# Patient Record
Sex: Male | Born: 1955 | Hispanic: Yes | Marital: Married | Smoking: Never smoker
Health system: Southern US, Community
[De-identification: ages and names within clinical notes are randomized; demographics above are authoritative.]

## PROBLEM LIST (undated history)

## (undated) DIAGNOSIS — I10 Essential (primary) hypertension: Secondary | ICD-10-CM

## (undated) DIAGNOSIS — E7800 Pure hypercholesterolemia, unspecified: Secondary | ICD-10-CM

## (undated) DIAGNOSIS — E119 Type 2 diabetes mellitus without complications: Secondary | ICD-10-CM

---

## 2010-06-26 ENCOUNTER — Inpatient Hospital Stay: Payer: Self-pay | Admitting: Surgery

## 2010-06-26 HISTORY — PX: APPENDECTOMY: SHX54

## 2010-06-29 LAB — PATHOLOGY REPORT

## 2015-10-05 ENCOUNTER — Emergency Department
Admission: EM | Admit: 2015-10-05 | Discharge: 2015-10-06 | Disposition: A | Discharge status: Home / Self Care | Payer: BLUE CROSS/BLUE SHIELD | Attending: Emergency Medicine | Admitting: Emergency Medicine

## 2015-10-05 ENCOUNTER — Encounter: Payer: Self-pay | Admitting: Emergency Medicine

## 2015-10-05 DIAGNOSIS — S52611A Displaced fracture of right ulna styloid process, initial encounter for closed fracture: Secondary | ICD-10-CM | POA: Insufficient documentation

## 2015-10-05 DIAGNOSIS — Y999 Unspecified external cause status: Secondary | ICD-10-CM | POA: Diagnosis not present

## 2015-10-05 DIAGNOSIS — M546 Pain in thoracic spine: Secondary | ICD-10-CM | POA: Diagnosis not present

## 2015-10-05 DIAGNOSIS — Y92410 Unspecified street and highway as the place of occurrence of the external cause: Secondary | ICD-10-CM | POA: Insufficient documentation

## 2015-10-05 DIAGNOSIS — M25512 Pain in left shoulder: Secondary | ICD-10-CM | POA: Insufficient documentation

## 2015-10-05 DIAGNOSIS — Y9389 Activity, other specified: Secondary | ICD-10-CM | POA: Diagnosis not present

## 2015-10-05 DIAGNOSIS — M7918 Myalgia, other site: Secondary | ICD-10-CM

## 2015-10-05 DIAGNOSIS — S161XXA Strain of muscle, fascia and tendon at neck level, initial encounter: Secondary | ICD-10-CM | POA: Diagnosis not present

## 2015-10-05 DIAGNOSIS — M25531 Pain in right wrist: Secondary | ICD-10-CM | POA: Diagnosis present

## 2015-10-05 MED ORDER — SODIUM CHLORIDE 0.9 % IV BOLUS (SEPSIS)
1000.0000 mL | Freq: Once | INTRAVENOUS | Status: AC
  Administered 2015-10-06: 1000 mL via INTRAVENOUS
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
## 2015-10-06 ENCOUNTER — Emergency Department: Payer: BLUE CROSS/BLUE SHIELD

## 2015-10-06 ENCOUNTER — Encounter: Payer: Self-pay | Admitting: Radiology

## 2015-10-06 DIAGNOSIS — S52611A Displaced fracture of right ulna styloid process, initial encounter for closed fracture: Secondary | ICD-10-CM | POA: Diagnosis not present

## 2015-10-06 LAB — CBC WITH DIFFERENTIAL/PLATELET
BASOS PCT: 0 %
Basophils Absolute: 0.1 10*3/uL (ref 0–0.1)
EOS ABS: 0.2 10*3/uL (ref 0–0.7)
EOS PCT: 1 %
HCT: 45.1 % (ref 40.0–52.0)
Hemoglobin: 15.6 g/dL (ref 13.0–18.0)
LYMPHS ABS: 3.1 10*3/uL (ref 1.0–3.6)
Lymphocytes Relative: 24 %
MCH: 32.7 pg (ref 26.0–34.0)
MCHC: 34.5 g/dL (ref 32.0–36.0)
MCV: 94.6 fL (ref 80.0–100.0)
MONO ABS: 0.5 10*3/uL (ref 0.2–1.0)
MONOS PCT: 4 %
Neutro Abs: 9.1 10*3/uL — ABNORMAL HIGH (ref 1.4–6.5)
Neutrophils Relative %: 71 %
Platelets: 151 10*3/uL (ref 150–440)
RBC: 4.77 MIL/uL (ref 4.40–5.90)
RDW: 12.9 % (ref 11.5–14.5)
WBC: 12.8 10*3/uL — ABNORMAL HIGH (ref 3.8–10.6)

## 2015-10-06 LAB — COMPREHENSIVE METABOLIC PANEL
ALK PHOS: 79 U/L (ref 38–126)
ALT: 36 U/L (ref 17–63)
ANION GAP: 5 (ref 5–15)
AST: 33 U/L (ref 15–41)
Albumin: 4.6 g/dL (ref 3.5–5.0)
BUN: 17 mg/dL (ref 6–20)
CHLORIDE: 103 mmol/L (ref 101–111)
CO2: 28 mmol/L (ref 22–32)
Calcium: 9.2 mg/dL (ref 8.9–10.3)
Creatinine, Ser: 0.99 mg/dL (ref 0.61–1.24)
GFR calc Af Amer: >60 mL/min (ref >60)
GFR calc non Af Amer: >60 mL/min (ref >60)
GLUCOSE: 234 mg/dL — AB (ref 65–99)
POTASSIUM: 3.7 mmol/L (ref 3.5–5.1)
SODIUM: 136 mmol/L (ref 135–145)
Total Bilirubin: 0.7 mg/dL (ref 0.3–1.2)
Total Protein: 7.5 g/dL (ref 6.5–8.1)

## 2015-10-06 LAB — LIPASE, BLOOD: Lipase: 37 U/L (ref 11–51)

## 2015-10-06 MED ORDER — HYDROMORPHONE HCL 1 MG/ML IJ SOLN
1.0000 mg | INTRAMUSCULAR | Status: AC
  Administered 2015-10-06: 1 mg via INTRAVENOUS
  Filled 2015-10-06: qty 1

## 2015-10-06 MED ORDER — DIAZEPAM 5 MG PO TABS: 5.0000 mg | ORAL_TABLET | Freq: Three times a day (TID) | ORAL | Status: AC | PRN

## 2015-10-06 MED ORDER — OXYCODONE-ACETAMINOPHEN 5-325 MG PO TABS: 1.0000 | ORAL_TABLET | Freq: Four times a day (QID) | ORAL | Status: AC | PRN

## 2015-10-06 MED ORDER — NAPROXEN 500 MG PO TABS: 500.0000 mg | ORAL_TABLET | Freq: Two times a day (BID) | ORAL | Status: AC

## 2015-10-06 MED ORDER — TETANUS-DIPHTH-ACELL PERTUSSIS 5-2.5-18.5 LF-MCG/0.5 IM SUSP
0.5000 mL | Freq: Once | INTRAMUSCULAR | Status: AC
  Administered 2015-10-06: 0.5 mL via INTRAMUSCULAR
  Filled 2015-10-06: qty 0.5

## 2015-10-06 MED ORDER — IOPAMIDOL (ISOVUE-370) INJECTION 76%
125.0000 mL | Freq: Once | INTRAVENOUS | Status: AC | PRN
  Administered 2015-10-06: 125 mL via INTRAVENOUS
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
## 2015-10-06 NOTE — Discharge Instructions (Signed)
No maneje o use maquinas peligrosas cuando esta tomando Percocet (oxycodone) o Valium (diazepam).  Haga cita con el ortopediatra por tratamiento de su herida de la muneca derecha.  Distensin cervical (Cervical Sprain) Una distensin cervical es una lesin en el cuello, en la que los tejidos fuertes y fibrosos (ligamentos) que unen los huesos del cuello, se distienden o se rompen. Una distensin cervical puede ser desde muy leve a muy grave. En los casos graves pueden hacer que las vrtebras del cuello se vuelvan inestables. Esto puede causar un dao en la mdula espinal y puede dar Environmental consultant a graves problemas del [place]. La cantidad de tiempo que demora la mejora de una distensin cervical depende de la causa y de la extensin de la lesin. La mayora de las veces se cura en 1 a 3 semanas. CAUSAS  Las distensiones graves pueden ser causadas por:   Lesiones por deportes de contacto (como en el ftbol Public house manager, rugby, [place], hockey, automovilismo, gimnasia, buceo, artes [employer] y boxeo).  Colisiones en vehculos de motor.  Lesiones de Social research officer, government cervical. Esta es una lesin por movimiento brusco de adelante hacia atrs de la cabeza y el cuello.  Cadas. La causa de las distensiones cervicales leves pueden ser:   Adoptar posiciones incmodas, como sostener el telfono entre la oreja y [place].  Sentarse en una silla que no ofrece el soporte adecuado.  Trabajar en una mesa de computadora mal diseada.  Las [employer] Util[employer] que requieren mirar hacia arriba o hacia abajo durante largos perodos. SNTOMAS   Dolor, sensibilidad, rigidez, o sensacin de ardor en la parte anterior, posterior o lateral del cuello. Este malestar puede aparecer inmediatamente despus de la lesin o puede desarrollarse lentamente y no empezar hasta 24 horas o ms despus de la lesin.  Dolor o sensibilidad que se siente directamente en la parte media posterior del cuello.  Dolor en el hombro o la zona superior  de la espalda.  Capacidad limitada para mover el cuello.  Dolor de [place].  Mareos.  Debilidad, entumecimiento u hormigueo en las manos o los brazos.  Espasmos musculares.  Dificultades para tragar o comer.  Sensibilidad e hinchazn en el cuello. DIAGNSTICO  La mayora de las veces, el mdico puede diagnosticar este problema mediante la historia clnica y un examen fsico. Su mdico le preguntar acerca de lesiones previas y problemas conocidos como artritis en el cuello. Podrn tomarle radiografas para determinar si hay otros problemas, como enfermedades en los huesos del cuello. Tambin puede ser [employer] realizar otras pruebas, como tomografas computadas o Health visitor.  TRATAMIENTO  El tratamiento depende de la gravedad de la distensin. Las distensiones leves se pueden tratar con reposo, manteniendo el cuello en su lugar (inmovilizacin) y usando medicamentos para Chief Technology Officer. Las distensiones graves deben ser inmediatamente inmovilizadas. Ser necesario completar el tratamiento para Engineer, materials, los espasmos musculares y otros sntomas, y puede incluir.  Medicamentos como calmantes para el dolor, anestsicos o relajantes musculares.  Fisioterapia. Esto puede incluir ejercicios de elongacin, fortalecimiento y Fish farm manager de Armed forces logistics/support/administrative officer. Los ejercicios y [place] mejor postura pueden ayudar a estabilizar el cuello, fortalecer los msculos y evitar que los sntomas vuelvan a Research officer, trade union. INSTRUCCIONES PARA EL CUIDADO EN EL HOGAR   Aplique hielo sobre la zona lesionada.  Ponga el hielo en una bolsa plstica.  Colquese una toalla entre la piel y la bolsa de hielo.  Deje el hielo durante 15 - 20 minutos y aplquelo 3 - 4 veces por Futures trader.  Si la  lesin fue grave, le indicarn el uso de un collarn cervical. El collarn cervical es un collar de dos piezas para impedir que el cuello se mueva [place] se [place].  Nose quite el collarn excepto que se lo indique su mdico.  Si  tiene el cabello largo, mantngalo fuera del collarn.  Consulte a su mdico antes de hacerle ajustes. Los [employer] [place] [employer] pueden ser requeridos con el tiempo para Careers information officer confort y reducir la presin sobre la barbilla o en la parte posterior de la cabeza.  Si le permiten quitarse el collarn para lavarlo o darse un bao, siga las indicaciones de su mdico acerca de cmo hacerlo con seguridad.  Mantenga el collarn limpio pasando un pao con agua y [place] y secndolo bien. Si el collarn tiene almohadillas removibles, qutelas cada 1-2 das para lavarlas a mano con agua y [place]. Deje que se sequen al aire. Debe secarlas bien antes de volver a colocarlas en el collarn.  Si le permiten quitarse el collarn para lavarlo y darse un bao, lave y seque la piel del cuello. Controle su piel para detectar irritacin o llagas. Si las tiene, informe a su mdico.  No conduzca vehculos mientras [place] el collarn.  Slo tome medicamentos de venta libre o recetados para Primary school teacher, el malestar o bajar la fiebre, segn las indicaciones de su mdico.  Cumpla con todas las visitas de control, segn le indique su mdico.  Cumpla con todas las sesiones de fisioterapia, segn le indique su mdico.  Haga los ajustes necesarios en su lugar de [place] para favorecer una buena postura.  Evite las posiciones y actividades que [employer] sntomas.  Haga precalentamiento y elongue antes de comenzar una actividad para Physiological scientist. SOLICITE ATENCIN MDICA SI:   El dolor no se alivia con los [employer].  No puede disminuir la dosis de analgsicos segn lo planificado.  Su nivel de actividad no mejora segn lo esperado. SOLICITE ATENCIN MDICA DE INMEDIATO SI:   Presenta cualquier hemorragia.  Siente Programme researcher, broadcasting/film/video.  Tiene signos de reaccin alrgica a los medicamentos.  Los sntomas empeoran.  Le aparecen sntomas nuevos e inexplicables.  Siente adormecimiento, hormigueo, debilidad o  parlisis en alguna parte del cuerpo. ASEGRESE DE QUE:   Comprende estas instrucciones.  Controlar su afeccin.  Recibir ayuda de inmediato si no mejora o si empeora.   Esta informacin no tiene Theme park manager el consejo del mdico. Asegrese de hacerle al mdico cualquier pregunta que tenga.   Document Released: 09/02/2008 Document Revised: 03/27/2013 Elsevier Interactive Patient Education [employer]  Distensin muscular. (Muscle Strain) Una distensin muscular es una lesin que se produce cuando un msculo se estira ms all de su largo normal. Cuando esto sucede, por lo general se desgarra un pequeo nmero de fibras musculares. La distensin muscular se califica en grados. Las distensiones de Museum/gallery conservator son aquellas en las cuales el desgarro y el dolor afectan a la menor cantidad de fibras musculares. Las distensiones de segundo y tercer grado involucran una proporcin cada vez mayor de desgarro y Engineer, mining.  En general, la recuperacin de una distensin muscular tarda de 1 a 2semanas. La curacin completa tarda de 5 a 6semanas.  CAUSAS  Las distensiones musculares ocurren cuando se aplica una fuerza violenta y repentina sobre un msculo y este se estira demasiado. Esto puede ocurrir cuando se Hydrographic surveyor, se practican deportes o en una cada.  FACTORES DE RIESGO La distensin muscular es especialmente comn en los atletas.  SIGNOS  Y SNTOMAS En el lugar de la distensin muscular se puede presentar lo siguiente:  Engineer, mining.  [place].  Hinchazn.  Dificultad para usar el msculo debido al dolor o a un funcionamiento anormal. DIAGNSTICO  El mdico le har un examen fsico y le har preguntas sobre sus antecedentes mdicos. TRATAMIENTO  Con frecuencia, el mejor tratamiento para una distensin muscular es el reposo, y la aplicacin de hielo y de compresas fras en la zona de la lesin.  INSTRUCCIONES PARA EL CUIDADO EN EL HOGAR   Use el mtodo PRICE (por sus  siglas en ingls) de tratamiento para estimular la curacin durante los primeros 2 a 3das posteriores a la lesin. El mtodo PRICE implica lo siguiente:  Proteger al msculo de nuevas lesiones.  Limitar la actividad y Lawyer la parte del cuerpo lesionada.  Aplicar hielo a la lesin. Para hacerlo, ponga hielo en una bolsa plstica. Coloque una toalla entre la piel y la bolsa de hielo. Luego aplique el hielo y djelo actuar de 15 a por hora. Despus del Press photographer, cambie a compresas de calor hmedo.  Comprimir la zona lesionada con una frula o venda elstica. Tenga cuidado de no ajustarla demasiado. Esto puede interferir con la circulacin sangunea o aumentar la hinchazn.  Mantener la zona lesionada por encima del nivel del corazn con la mayor frecuencia posible.  Utilice los medicamentos de venta libre o recetados para Primary school teacher, el malestar o la fiebre, segn se lo indique el mdico.  Education officer, environmental un calentamiento antes de hacer ejercicio ayuda a prevenir distensiones musculares futuras. SOLICITE ATENCIN MDICA SI:   Siente un dolor cada vez ms intenso o hinchazn en la zona lesionada.  Siente adormecimiento, hormigueo o nota una prdida importante de fuerza en la zona lesionada. ASEGRESE DE QUE:   Comprende estas instrucciones.  Controlar su afeccin.  Recibir ayuda de inmediato si no mejora o si empeora.   Esta informacin no tiene Theme park manager el consejo del mdico. Asegrese de hacerle al mdico cualquier pregunta que tenga.   Document Released: 03/16/2005 Document Revised: 03/27/2013 Elsevier Interactive Patient Education 2016 [employer].  Fractura de [place] (Wrist Fracture) La fractura de mueca es la rotura de uno de los huesos que la forman. La mueca est formada por ocho huesos pequeos en la palma de la mano (huesos carpianos) y [employer] largos que componen el Product manager (radio) y (cbito).  CAUSAS   Un golpe directo en la  mueca.  Cada sobre una mano extendida.  Traumatismos, como un accidente automovilstico o una cada. FACTORES DE RIESGO Los factores de riesgo de fractura de [place] incluyen:   Education administrator deportes de contacto y de alto riesgo, como esqu, ciclismo y patinaje sobre hielo.  Tomar corticoides.  Fumar.  Ser mujer.  Ser de Engineer, manufacturing.  Consumir ms de tres bebidas alcohlicas por da.  Tener baja densidad sea o disminucin de la densidad sea (osteoporosis u osteopenia).  La edad. En los [employer], la densidad sea [place].  Las [employer].  Antecedentes de fracturas previas. SIGNOS Y SNTOMAS Los sntomas de una fractura de mueca incluyen dolor con la palpacin, hematomas e hinchazn. Adems, la mueca puede colgar en una posicin extraa o verse deformada.  DIAGNSTICO El diagnstico puede incluir:  Examen fsico.  Radiografas. TRATAMIENTO El tratamiento depende de muchos factores, por ejemplo el tipo y la ubicacin de la [place], su edad y su nivel de [place]. El tratamiento puede ser quirrgico o no quirrgico.  Tratamiento no quirrgico  Le colocarn un yeso o una frula en la [place], si el hueso est en una posicin correcta. Si la fractura no est en una posicin correcta, tal vez sea necesario que el mdico corrija el desplazamiento antes de poner el yeso o la frula. Generalmente, el yeso o la frula se usarn durante varias semanas.  Tratamiento quirrgico En algunos CIT Groupcasos las partes del hueso estn tan fuera de Environmental consultantlugar que es necesario recurrir a Physiological scientistuna ciruga para Scientific laboratory techniciancolocar un dispositivo que las mantenga unidas mientras se [place]. Segn el tipo de fractura, hay diferentes opciones para mantener el Dow Chemicalhueso en su lugar mientras se consolida, por ejemplo un yeso y pernos de metal.  INSTRUCCIONES PARA EL CUIDADO EN EL HOGAR  Mantenga la mueca lesionada elevada y Valero Energymueva los dedos tanto como pueda.  No ejerza presin sobre cualquier parte del yeso o  la frula. Podra romperse.   Use una bolsa plstica para proteger el yeso o la frula cuando tome un bao o [place]una ducha. No sumerja el yeso o la frula en el agua.  Tome los medicamentos solamente como se lo haya indicado el mdico.  Mantenga el yeso o la frula limpios y secos. Si se mojan, se daan o de repente le ajustan demasiado, comunquese de inmediato con el mdico.  No consuma ningn producto que contenga tabaco, como cigarrillos, tabaco de Theatre managermascar o Administrator, Civil Servicecigarrillos electrnicos. El tabaco puede retardar la consolidacin de la fractura. Si necesita ayuda para dejar de fumar, consulte al mdico.  Concurra a todas las visitas de control como se lo haya indicado el mdico. Esto es importante.  Pregntele al mdico si debe tomar suplementos de calcio y vitaminasC yD para promover la consolidacin de Printmakerla fractura. SOLICITE ATENCIN MDICA SI:   El yeso o la frula se daan, se rompen o se mojan.  Tiene fiebre.  Tiene escalofros.  Siente un dolor fuerte y continuo o est ms hinchado que antes de que le colocaran el yeso. SOLICITE ATENCIN MDICA DE INMEDIATO SI:   La mano o las uas de la mano del brazo lesionado se tornan azules o grises, o estn fras o adormecidas.  Pierde la sensibilidad en los dedos de la mano del brazo lesionado. ASEGRESE DE QUE:  Comprende estas instrucciones.  Controlar su afeccin.  Recibir ayuda de inmediato si no mejora o si empeora.   Esta informacin no tiene Theme park managercomo fin reemplazar el consejo del mdico. Asegrese de hacerle al mdico cualquier pregunta que tenga.   Document Released: 03/16/2005 Document Revised: 06/27/2014 Elsevier Interactive Patient Education Yahoo[employer] Inc2016 Elsevier Inc.

## 2015-10-25 ENCOUNTER — Other Ambulatory Visit: Payer: Self-pay

## 2015-10-25 ENCOUNTER — Emergency Department
Admission: EM | Admit: 2015-10-25 | Discharge: 2015-10-25 | Disposition: A | Discharge status: Home / Self Care | Payer: BLUE CROSS/BLUE SHIELD | Attending: Emergency Medicine | Admitting: Emergency Medicine

## 2015-10-25 DIAGNOSIS — R42 Dizziness and giddiness: Secondary | ICD-10-CM

## 2015-10-25 DIAGNOSIS — E119 Type 2 diabetes mellitus without complications: Secondary | ICD-10-CM | POA: Insufficient documentation

## 2015-10-25 DIAGNOSIS — E860 Dehydration: Secondary | ICD-10-CM | POA: Diagnosis not present

## 2015-10-25 LAB — CBC
HCT: 45.1 % (ref 40.0–52.0)
HEMOGLOBIN: 15.6 g/dL (ref 13.0–18.0)
MCH: 32.4 pg (ref 26.0–34.0)
MCHC: 34.5 g/dL (ref 32.0–36.0)
MCV: 94 fL (ref 80.0–100.0)
PLATELETS: 178 10*3/uL (ref 150–440)
RBC: 4.8 MIL/uL (ref 4.40–5.90)
RDW: 13.1 % (ref 11.5–14.5)
WBC: 7.6 10*3/uL (ref 3.8–10.6)

## 2015-10-25 LAB — BASIC METABOLIC PANEL
ANION GAP: 9 (ref 5–15)
BUN: 18 mg/dL (ref 6–20)
CALCIUM: 9.4 mg/dL (ref 8.9–10.3)
CO2: 27 mmol/L (ref 22–32)
CREATININE: 0.97 mg/dL (ref 0.61–1.24)
Chloride: 101 mmol/L (ref 101–111)
GLUCOSE: 298 mg/dL — AB (ref 65–99)
Potassium: 4.5 mmol/L (ref 3.5–5.1)
Sodium: 137 mmol/L (ref 135–145)

## 2015-10-25 LAB — GLUCOSE, CAPILLARY: Glucose-Capillary: 299 mg/dL — ABNORMAL HIGH (ref 65–99)

## 2015-10-25 LAB — URINALYSIS COMPLETE WITH MICROSCOPIC (ARMC ONLY)
BILIRUBIN URINE: NEGATIVE
Bacteria, UA: NONE SEEN
Hgb urine dipstick: NEGATIVE
KETONES UR: NEGATIVE mg/dL
Leukocytes, UA: NEGATIVE
NITRITE: NEGATIVE
Protein, ur: NEGATIVE mg/dL
SPECIFIC GRAVITY, URINE: 1.018 (ref 1.005–1.030)
Squamous Epithelial / LPF: NONE SEEN
pH: 6 (ref 5.0–8.0)

## 2015-10-25 LAB — TROPONIN I

## 2015-10-25 MED ORDER — METFORMIN HCL 500 MG PO TABS: 500.0000 mg | ORAL_TABLET | Freq: Two times a day (BID) | ORAL | Status: AC

## 2015-10-25 MED ORDER — ONDANSETRON 4 MG PO TBDP: 4.0000 mg | ORAL_TABLET | Freq: Three times a day (TID) | ORAL | Status: AC | PRN
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
## 2015-10-25 NOTE — ED Notes (Signed)
Pt reports dizziness for past 5 days and pain in neck. States when he wakes up he feels dizzy and it goes away but today the dizziness remains. Denies any other symptoms

## 2015-10-25 NOTE — Discharge Instructions (Signed)
Deshidratacin en los adultos (Dehydration, Adult) La deshidratacin es un cuadro clnico que se produce cuando no se tiene la cantidad suficiente de lquido o de agua en el organismo. Se produce cuando se toma menos lquido del que se pierde. Los rganos [employer] riones, el cerebro y el Gravity, no pueden funcionar sin una cantidad [place] de agua y Press photographer. Cualquier prdida de lquidos del organismo puede causar deshidratacin.  La deshidratacin puede ser leve o grave. Este cuadro clnico se debe tratar de inmediato para evitar que se agrave. CAUSAS  Este cuadro clnico puede deberse a lo siguiente:  Vmitos.  Diarrea.  Exceso de sudoracin, por ejemplo, al realizar actividad fsica cuando hace calor o hay humedad.  No beber la cantidad suficiente de lquido mientras realizan actividad fsica extenuante o cuando estn enfermos.  Excesiva eliminacin de [place].  Cristy Hilts.  Algunos medicamentos. FACTORES DE RIESGO Es ms probable que este cuadro clnico se [employer] en:  Las personas que estn tomando determinados medicamentos que causan la prdida excesiva de lquido del organismo (diurticos).   Las personas que sufren una enfermedad crnica, como diabetes, que puede aumentar la miccin.  Adultos mayores.   Las personas que viven a Engineer, agricultural.   Las personas que practican deportes de resistencia.  SNTOMAS  Deshidratacin leve  Sed.  Labios secos.  Sequedad leve en la boca.  Piel seca y caliente. Deshidratacin [employer].   Calambres musculares.   Elmon Else y disminucin de la produccin de [place].   Disminucin de la produccin de lgrimas.   Dolor de [place].   Sensacin de desvanecimiento, especialmente al ponerse de pie.  Deshidratacin grave  Cambios en la piel.   Piel fra y hmeda.   La piel no vuelve rpidamente a su lugar cuando se la suelta luego de pellizcarla ligeramente.   Cambios en los lquidos  corporales.   Sed extrema.   Falta de lgrimas.   Imposibilidad de transpirar cuando la temperatura corporal es alta, por ejemplo, cuando hace calor.   Mnima produccin de [place].   Cambios en las constantes vitales.   Pulso rpido y dbil (ms de 100pulsaciones por minuto cuando est quieto).   Respiracin rpida.   Presin arterial baja.   Otros cambios.   Ojos hundidos.   Manos y pies fros.   Confusin.  Aletargamiento y dificultad para mantenerse despierto.  Desmayos (sncope).   Prdida de peso a Control and instrumentation engineer.   Prdida del conocimiento. DIAGNSTICO  Este cuadro clnico se puede diagnosticar en funcin de los sntomas. Tambin se pueden hacer estudios para determinar la gravedad de la deshidratacin. Estos estudios pueden [employer] siguientes:   Anlisis de [place].   Anlisis de [place].  TRATAMIENTO  El tratamiento de este cuadro clnico depende de la gravedad. La deshidratacin leve o moderada a menudo puede tratarse en la casa. El tratamiento se debe comenzar de inmediato. No espere hasta que la deshidratacin sea grave. La deshidratacin grave debe ser tratada en el hospital. Tratamiento para la deshidratacin leve  Beber abundante agua para reemplazar el lquido que se perdi.   Reemplazar los minerales en la sangre (electrolitos) que se pueden haber perdido.  Tratamiento para la deshidratacin moderada  Consumir una solucin de rehidratacin oral (SRO). Tratamiento para la deshidratacin grave  Recibir lquidos a travs de una va intravenosa (IV).   Recibir una solucin de Brewing technologist a travs de una sonda de alimentacin que se coloca a travs de la nariz Research scientist (medical) (sonda nasogstrica  o sonda NG).  Corregir las [facility]. INSTRUCCIONES PARA EL CUIDADO EN EL HOGAR   Beba suficiente lquido para mantener la orina clara o de color amarillo plido.   Beba lentamente pequeos sorbos de agua o de  lquido. Tambin puede chupar cubos de hielo.  Consuma alimentos o bebidas que contengan electrolitos. Como por ejemplo, bananas y bebidas deportivas.  Tome los medicamentos de venta libre y los recetados solamente como se lo haya indicado el mdico.   Prepare la solucin de rehidratacin oral de acuerdo con las indicaciones del fabricante. Tome sorbos de la solucin de rehidratacin oral cada 24mnutos hasta que la orina se normalice.  Si tiene vmitos o diarrea, siga tratando de beber agua, una solucin de rehidratacin oral o ambos.   Si tiene dAlabaster debe evitar lo siguiente:   Las bebidas que contengan cafena.   El jugo de frutas.   LToysRus   Las bebidas gaseosas.  No tome comprimidos de sal. Esto puede causar la acumulacin excesiva de sodio en el organismo (hipernatremia).  SOLICITE ATENCIN MDICA SI:  No puede comer ni beber sin vomitar.  Ha tenido diarrea moderada durante ms de 24horas.  Tiene fiebre. SOLICITE ATENCIN MDICA DE INMEDIATO SI:   Siente sed extrema.  Tiene una diarrea intensa.  No ha orinado durante 6 a 8horas o solo ha orinado una cantidad pequea de oMauritius  Tiene la piel arrugada.  Est mareado, confundido o tiene ambos sntomas.   Esta informacin no tiene cMarine scientistel consejo del mdico. Asegrese de hacerle al mdico cualquier pregunta que tenga.   Document Released: 06/06/2005 Document Revised: 02/25/2015 Elsevier Interactive Patient Education 2[employer]wide Mutual Insurance  Diabetes y actividad fsica (Diabetes and Exercise) Hacer actividad fsica con regularidad es muy importante. No se trata solo de pThe Mutual of Omaha Tiene muchos otros beneficios, como por ejemplo:  Mejorar el estado fsico, la flexibilidad y la resistencia.  Aumenta la densidad sea.  Ayuda a cTechnical sales engineer  Disminuye la gAir traffic controller  Aumenta la fuerza muscular.  Reduce el estrs y las tensiones.  Mejora el estado de salud  general. Las personas diabticas que realizan actividad fsica tienen beneficios adicionales debido al ejercicio:  Reduce el apetito.  El organismo mejora el uso del azcar (glucosa) de la [place]  Ayuda a disminuir o cProduct/process development scientist  Disminuye la presin arterial.  Ayuda a disminuir los lpidos en la sangre (colesterol y triglicridos).  El organismo mejora el uso de la insulina porque:  Aumenta la sensibilidad del organismo a la insulina.  Reduce las necesidades de insulina del organismo.  Disminuye el riesgo de enfermedad cardaca por la actividad fsica ya que  disminuye el colesterol y tSonic Automotivetriglicridos.  Aumenta los niveles de colesterol bueno (como las lipoprotenas de alta densidad [HDL]) en el organismo.  Disminuye los niveles de glucosa en la sMaroa SU PLAN DE ACTIVIDAD  Elija una actividad que disfrute y establezca objetivos realistas. Para ejercitarse sin riesgos, debe comenzar a pPsychologist, prison and probation servicescualquier actividad fsica nueva lentamente y aumentar la intensidad del ejercicio de forma gradual con el tiempo. Su mdico o educador en diabetes podrn ayudarlo a crear un plan de actividades que lo beneficie. Las recomendaciones generales incluyen lo siguiente:  APsychologist, clinicala los nios para que realicen al menos 60 minutos de actividad fsica cArmed forces operational officer  Estirarse y rOptometristejercicios de entrenamiento de la fuerza, como yoga o levantamiento de pesas, por lo menos 2 veces por semana.  Realizar en total por lo menos 150 minutos de ejercicios de intensidad moderada cada semana, como caminar a paso ligero o hacer gimnasia acutica.  Hacer ejercicio fsico por lo menos 3 das por semana y no dejar pasar ms de 2 das seguidos sin ejercitarse.  Evitar los perodos largos de inactividad (90 minutos o ms tiempo). Cuando deba pasar mucho tiempo sentado, haga pausas frecuentes para caminar o estirarse. RECOMENDACIONES PARA REALIZAR EJERCICIOS CUANDO SE TIENE  DIABETES TIPO 1 O TIPO 2   Controle la glucosa en la sangre antes de comenzar. Si el nivel de glucosa en la sangre es de ms de 240 mg/dl, controle las cetonas en la [place]. No haga actividad fsica si hay cetonas.  Evite inyectarse insulina en las zonas del cuerpo que ejercitar. Por ejemplo, evite inyectarse insulina en:  Los brazos, si juega al tenis.  Las piernas, si corre.  Lleve un registro de:  Los alimentos que consume antes y despus de TEFL teacher.  Los momentos esperables de picos de accin de la insulina.  Los niveles de glucosa en la sangre antes y despus de hacer ejercicios.  El tipo y cantidad de [place] fsica que Musician.  Revise los registros con su mdico. El mdico lo ayudar a Actor pautas para ajustar la cantidad de alimento y las cantidades de insulina antes y despus de Field seismologist ejercicios.  Si toma insulina o agentes hipoglucemiantes por va oral, observe si hay signos y sntomas de hipoglucemia. Entre los que se incluyen:  Mareos.  Temblores.  Sudoracin.  Escalofros.  Confusin.  Beba gran cantidad de agua mientras hace ejercicios para evitar la deshidratacin o los golpes de Freight forwarder. Durante la actividad fsica se pierde agua corporal que se debe reponer.  Comente con su mdico antes de comenzar un programa de actividad fsica para verificar que sea seguro para usted. Recuerde, cualquier actividad es mejor que ninguna.   Esta informacin no tiene Marine scientist el consejo del mdico. Asegrese de hacerle al mdico cualquier pregunta que tenga.   Document Released: 06/26/2007 Document Revised: 10/21/2014 Elsevier Interactive Patient Education 2016 [place] (Dizziness) Los mareos son un problema muy frecuente. Se trata de una sensacin de inestabilidad o de desvanecimiento. Puede sentir que se va a desmayar. Un mareo puede provocarle una lesin si se tropieza o se cae. Las Engineer, manufacturing de todas las edades pueden sufrir  Tree surgeon, Armed forces training and education officer es ms frecuente en los adultos [place]. Esta afeccin puede tener muchas causas, entre las que se pueden [employer], la deshidratacin y [place]. INSTRUCCIONES PARA EL CUIDADO EN EL HOGAR Estas indicaciones pueden ayudarlo con el trastorno: Comida y bebida  Beba suficiente lquido para Theatre manager la orina clara o de color amarillo plido. Esto evita la deshidratacin. Trate de beber ms lquidos transparentes, como agua.  No beba alcohol.  Limite el consumo de cafena si el mdico se lo indica.  Limite el consumo de sal si el mdico se lo indica. Actividad  Evite los movimientos rpidos.  Levntese de las sillas con lentitud y apyese hasta sentirse bien.  Por la maana, sintese primero a un lado de la cama. Cuando se sienta bien, pngase lentamente de pie mientras se sostiene de algo, hasta que sepa que ha logrado el equilibrio.  Mueva las piernas con frecuencia si debe estar de pie en un lugar durante mucho tiempo. Mientras est de pie, contraiga y relaje los msculos de las piernas.  No conduzca vehculos ni opere maquinaria pesada si se siente  mareado.  Evite agacharse si se siente mareado. En su casa, coloque los objetos de modo que le resulte fcil alcanzarlos sin Office manager. Estilo de vida  No consuma ningn producto que contenga tabaco, lo que incluye cigarrillos, tabaco de Higher education careers adviser o Psychologist, sport and exercise. Si necesita ayuda para dejar de fumar, consulte al [employer].  Trate de reducir el nivel de estrs practicando actividades como el yoga o la meditacin. Hable con el mdico si necesita ayuda. Instrucciones generales  Controle sus mareos para ver si hay cambios.  Tome los medicamentos solamente como se lo haya indicado el mdico. Hable con el mdico si cree que los medicamentos que est tomando son la causa de sus mareos.  Infrmele a un amigo o a un familiar si se siente mareado. Pdale a esta persona que llame al mdico si observa  cambios en su comportamiento.  Concurra a todas las visitas de control como se lo haya indicado el mdico. Esto es importante. SOLICITE ATENCIN MDICA SI:  Los [place].  Los [employer] o la sensacin de Engineer, petroleum.  Siente nuseas.  Ha perdido la audicin.  Aparecen nuevos sntomas.  Cuando est de pie se siente inestable o que la habitacin da vueltas. SOLICITE ATENCIN MDICA DE INMEDIATO SI:  Vomita o tiene diarrea y no puede comer ni beber nada.  Tiene dificultad para hablar, para caminar, para tragar o para [employer], las [place] piernas.  Siente una debilidad generalizada.  No piensa con claridad o tiene dificultades para armar oraciones. Es posible que un amigo o un familiar adviertan que esto ocurre.  Tiene dolor de pecho, dolor abdominal, sudoracin o Risk manager.  Hay cambios en la visin.  Observa un sangrado.  Tiene dolores de [place].  Tiene dolor o rigidez en el cuello.  Tiene fiebre.   Esta informacin no tiene Marine scientist el consejo del mdico. Asegrese de hacerle al mdico cualquier pregunta que tenga.   Document Released: 06/06/2005 Document Revised: 10/21/2014 Elsevier Interactive Patient Education 2016 [employer].  Diabetes mellitus tipo2, adultos (Type 2 Diabetes Mellitus, Adult) La diabetes mellitus tipo2, generalmente denominada diabetes tipo2, es una enfermedad prolongada (crnica). En la diabetes tipo2, el pncreas no produce suficiente insulina (una hormona), las clulas son menos sensibles a la insulina que se produce (resistencia a la insulina), o ambos. Normalmente, la [employer] azcares de los alimentos a las clulas de los tejidos. Las clulas de los tejidos [employer] azcares para Dealer. La falta de insulina o la falta de una respuesta normal a la insulina hace que el exceso de azcar se acumule en la sangre en lugar de Location manager en las clulas de los tejidos. Como  resultado, se producen niveles altos de Dispensing optician (hiperglucemia). El efecto de los niveles altos de azcar (glucosa) puede causar muchas complicaciones.  La diabetes tipo2 antes tambin se denominaba diabetes del [place], pero puede ocurrir a Hotel manager.  [place] persona tiene mayor predisposicin a desarrollar diabetes tipo 2 si alguien en su familia tiene la enfermedad y tambin tiene uno o ms de los siguientes factores de riesgo principales:  Aumento de [place], sobrepeso u obesidad.  Estilo de vida sedentario.  Una historia de consumo constante de alimentos de altas caloras. Mantener un peso saludable y realizar actividad fsica regular puede reducir la probabilidad de desarrollar diabetes tipo 2. SNTOMAS  Una persona con diabetes tipo 2 no presenta sntomas en un principio. Los sntomas de la diabetes  tipo 2 aparecen lentamente. Los sntomas son:  Aumento de la sed (polidipsia).  Aumento de la miccin (poliuria).  Orina con ms frecuencia durante la noche (nocturia).  Cambios repentinos en el peso o sin motivo aparente.  Infecciones frecuentes y recurrentes.  Cansancio (fatiga).  Debilidad.  Cambios en la visin, como visin borrosa.  Olor a Medical illustrator.  Dolor abdominal.  Nuseas o vmitos.  Cortes o moretones que tardan en sanarse.  Hormigueo o adormecimiento de las manos y los pies.  Una herida abierta en la piel (lcera). DIAGNSTICO Con frecuencia la diabetes tipo 2 no se diagnostica hasta que se presentan las complicaciones de la diabetes. La diabetes tipo 2 se diagnostica cuando los sntomas o las complicaciones se presentan y cuando aumentan los niveles de glucosa en la [place]. El nivel de glucosa en la sangre puede controlarse en uno o ms de los siguientes anlisis de sangre:  Medicin de glucosa en la sangre en [place]. No se le permitir comer durante al menos 8 horas antes de que se tome [place] de  [place].  Pruebas al azar de glucosa en la sangre. El nivel de glucosa en la sangre se controla en cualquier momento del da sin importar el momento en que haya comido.  Prueba de A1c (hemoglobina glucosilada) Una prueba de A1c proporciona informacin sobre el control de la glucosa en la sangre durante los ltimos 3 meses.  Prueba de tolerancia a la glucosa oral (PTGO). La glucosa en la sangre se mide despus de no haber comido (ayunas) durante dos horas y despus de beber una bebida que contenga glucosa. TRATAMIENTO   Usted puede necesitar administrarse insulina o medicamentos para la diabetes todos los das para [employer] niveles de glucosa en la sangre en el rango deseado.  Si [place] insulina, tal vez necesite ajustar la dosis segn los carbohidratos que haya consumido en cada comida o colacin.  [employer] del tratamiento, se recomienda hacer cambios en el estilo de vida. Estos pueden incluir lo siguiente:  Teaching laboratory technician de alimentacin personalizado elaborado por un nutricionista.  Hacer ejercicio fsico a diario. El mdico establecer los objetivos personalizados del tratamiento para usted en funcin de su edad, los medicamentos que toma, el tiempo que hace que tiene diabetes y cualquier otra enfermedad que padezca. Generalmente, el objetivo del tratamiento es [employer] siguientes niveles sanguneos de glucosa:  Antes de las comidas (preprandial): de 80 a 130 mg/dl.  Antes de las comidas (preprandial): de 80 a 130 mg/dl.  A1c: menos del 6,5 % al 7 %. INSTRUCCIONES PARA EL CUIDADO EN EL HOGAR   Controle su nivel de hemoglobina A1c dos veces al ao.  Contrlese a diario Retail buyer de glucosa en la sangre segn las indicaciones de su mdico.  Supervise las cetonas en la orina cuando est enferma y segn las indicaciones de su [place] medicamento para la diabetes o adminstrese insulina segn las indicaciones de su mdico para Contractor nivel de glucosa en la sangre en  el rango deseado.  Nunca se quede sin medicamento para la diabetes o sin insulina. Es necesario que la reciba [employer].  Si [place] insulina, tal vez deba ajustar la cantidad de insulina administrada segn los carbohidratos consumidos. Los hidratos de carbono pueden aumentar los niveles de glucosa en la sangre, pero deben incluirse en su dieta. Los hidratos de carbono aportan vitaminas, minerales y [place] que son Ardelia Mems parte esencial de una dieta saludable. Los hidratos de  carbono se encuentran en frutas, verduras, cereales integrales, productos lcteos, legumbres y alimentos que contienen azcares aadidos.  Consuma alimentos saludables. Programe una cita con un nutricionista certificado para que lo ayude a Building services engineer de alimentacin adecuado para usted.  Baje de peso si es necesario.  Lleve una tarjeta de alerta mdica o use una pulsera o medalla de alerta mdica.  Lleve con usted una colacin de 15gramos de hidratos de carbono en todo momento para controlar los niveles bajos de glucosa en la sangre (hipoglucemia). Algunos ejemplos de colaciones de 15gramos de hidratos de carbono son los siguientes:  Tabletas de glucosa, 3 o 4.  Gel de glucosa, tubo de 15 gramos.  Pasas de uva, 2 cucharadas (24 gramos).  Caramelos de goma, 6.  Galletas de [place], 8.  Gaseosa comn, 4onzas (120mlilitros).  Pastillas de goma, 9.  Reconocer la hipoglucemia. La hipoglucemia se produce cuando los niveles de glucosa en la sangre son de 70 mg/dl o menos. El riesgo de hipoglucemia aumenta durante el ayuno o cuando se saltea las comidas, durante o despus de rOptometristejercicio intenso y mLebecduerme. Los sntomas de hipoglucemia son:  Temblores o sacudidas.  Disminucin de la capacidad de concentracin.  Sudoracin.  Aumento de la frecuencia cardaca.  Dolor de c[place]  Sequedad en la boca.  Hambre.  Irritabilidad.  Ansiedad.  Sueo agitado.  Alteracin del habla o de la  coordinacin.  Confusin.  Tratar la hipoglucemia rpidamente. Si usted est alerta y puede tragar con seguridad, siga la regla de 15/15 que consiste en:  TMerck & Co15 y 20gramos de glucosa de accin rpida o carbohidratos. Las opciones de accin rpida son un gel de glucosa, tabletas de glucosa, o 4 onzas (120 ml) de jugo de frutas, gaseosa comn, o leche baja en grasa.  Compruebe su nivel de glucosa en la sangre 15 minutos despus de tomar la glucosa.  Tome entre 15 y 289gramos ms de glucosa si el nivel de glucosa en la sangre todava es de '70mg'$ /dl o inferior.  Ingiera una comida o una colacin en el lapso de 1 hora una vez que los niveles de glucosa en la sangre vuelven a la normalidad.  Est atento a si siente mucha sed u orina con mayor frecuencia, porque son signos tempranos de hiperglucemia. El reconocimiento temprano de la hiperglucemia permite un tratamiento oportuno. Trate la hiperglucemia segn le indic su mdico.  Haga, al menos, 1558mutos de actividad fsica moderada a la semana, distribuidos en, por lo menos, 3das a la semana o como lo indique su mdico. Adems, debe realizar ejercicios de resistencia por lo menos 2veces a la semana o como lo indique su mdico. Trate de no permanecer inactivo durante ms de 9062mtos seguidos.  Ajuste su medicamento y la ingesta de alimentos, segn sea necesario, si inicia un nuevo ejercicio o deporte.  Siga su plan para los das de enfermedad cuando no puede comer o beber como de cosOcean GroveNo consuma ningn producto que contenga tabaco, como cigarrillos, tabaco de masHigher education careers advisercigPsychologist, sport and exercisei necesita ayuda para dejar de fumar, hable con el mdico.  Limite el consumo de alcohol a no ms de 1 medida por da en las mujeres no embarazadas y 2 medidas en los hombres. Debe beber alcohol solo mientras come. Hable con su mdico acerca de si el alcohol es seguro para usted. Informe a su mdico si bebe alcohol varias veces a la  semana.  Concurra a todas las visitas de control como se  lo haya indicado el mdico. Esto es importante.  Programe un examen de la vista poco despus del diagnstico de diabetes tipo 2 y luego anualmente.  Realice diariamente el cuidado de la piel y de los pies. Examine su piel y los pies diariamente para ver si tiene cortes, moretones, enrojecimiento, problemas en las uas, sangrado, ampollas o Advertising account planner. Su mdico debe hacerle un examen de los pies una vez por ao.  Cepllese los dientes y encas por lo menos dos veces al da y use hilo dental al menos una vez por da. Concurra regularmente a las visitas de control con el dentista.  Comparta su plan de control de diabetes en el trabajo o en la escuela.  Mantngase al da con las vacunas. Se recomienda que se vacune contra la gripe todos los [place]. Adems, que se vacune contra la neumona (vacuna antineumoccica). Si es mayor de 71 aos y nunca se Animator la neumona, esta vacuna puede administrarse como una serie de dos vacunas por separado. Pregntele al mdico qu otras vacunas se pueden recomendar.  Aprenda a Dealer.  Obtenga la mayor cantidad posible de informacin sobre la diabetes y solicite ayuda siempre que sea necesario.  Busque programas de rehabilitacin y participe en ellos para mantener o mejorar su independencia y su calidad de vida. Solicite la derivacin a fisioterapia o terapia ocupacional si se le [employer] o la mano, o tiene problemas para asearse, vestirse, comer, o durante la [place] fsica. SOLICITE ATENCIN MDICA SI:   No puede comer alimentos o beber por ms de 6 horas.  Tuvo nuseas o ha vomitado durante ms de 6 horas.  Su nivel de glucosa en la sangre es mayor de 240 mg/dl.  Presenta algn cambio en el estado mental.  Desarrolla una enfermedad grave adicional.  Tuvo diarrea durante ms de 6 horas.  Ha estado enfermo o ha tenido fiebre durante un par [address] y no mejora.  Siente  dolor al practicar cualquier actividad fsica. SOLICITE ATENCIN MDICA DE INMEDIATO SI:  Tiene dificultad para respirar.  Tiene niveles de cetonas moderados a altos.   Esta informacin no tiene Theme park manager el consejo del mdico. Asegrese de hacerle al mdico cualquier pregunta que tenga.   Document Released: 06/06/2005 Document Revised: 02/25/2015 Elsevier Interactive Patient Education 2016 [employer].  Diabetes y normas bsicas de atencin mdica (Diabetes and Standards of Medical Care) La diabetes es una enfermedad complicada. El equipo que trate su diabetes deber incluir un nutricionista, un enfermero, un educador para la diabetes, un oftalmlogo y ms. Para que todas las personas conozcan sobre su enfermedad y para que los pacientes tengan los cuidados que necesitan, se crearon las siguientes normas bsicas para un mejor control. A continuacin se indican los estudios, vacunas, medicamentos, educacin y planes que necesitar. Prueba de HbA1c Esta prueba muestra cmo ha sido controlada su glucosa en los ltimos 2 o 3 meses. Se utiliza para verificar si el plan de control de la diabetes debe ser ajustado.   Hgalos al menos 2 veces al ao si cumple los objetivos del [place].  Si le han cambiado el tratamiento o si no cumple con los objetivos del [place], debe hacerlo 4 veces al ao. Control de la presin arterial.  Hgalas en cada visita mdica de rutina. El objetivo es tener menos de 140/90 mm Hg en la mayora de las personas, pero 130/80 mm Hg en algunos casos. Consulte a su mdico acerca de su objetivo. Examen dental.  Chauncy Passy  regularmente a las visitas de control con el dentista. Examen ocular.  Si le diagnosticaron diabetes tipo 1 siendo un nio, debe hacerse estudios al llegar a los 10 aos o ms y si ha sufrido de diabetes durante 3 a 5 aos. Se recomienda hacer anualmente los exmenes oculares despus de ese examen inicial.  Si le diagnosticaron  diabetes tipo 1 siendo adulto, hgase un examen dentro de los 5 aos del diagnstico y luego una vez por ao.  Si le diagnosticaron diabetes tipo 2, hgase un estudio lo antes posible despus del diagnstico y luego una vez por ao. Examen de los pies  Se har una inspeccin visual en cada visita mdica de rutina. Estos controles observarn si hay cortes, lesiones u otros problemas en los pies.  Debe realizarse un examen completo de los pies cada ao. Esto incluye revisar la estructura y la piel de los pies, y examinar los pulsos y la sensacin de los pies.  Diabetes tipo 1: La primera prueba se realiza 5 aos despus del diagnstico.  Diabetes tipo 2: La primera prueba se realiza en el momento del diagnstico.  Contrlese los pies todas las noches para ver si hay cortes, lesiones u otros problemas. Comunquese con su mdico si observa que no se curan. Estudio de la funcin renal (microalbmina en orina)  Debe realizarse una vez por ao.  Diabetes tipo 1: La primera prueba se realiza a los 5 aos despus del diagnstico.  Diabetes tipo2: La primera prueba se realiza en el momento del diagnstico.  La creatinina srica y el ndice de filtracin glomerular estimada (eGFR, por sus siglas en ingls) se realizan una vez por ao para informar el nivel de enfermedad renal crnica, si la hubiera. Perfil lipdico (colesterol, HDL, LDL, triglicridos).  La mayora de las personas lo hacen cada 5 aos.  En relacin al LDL, el objetivo es tener menos de 100 mg/dl. Si tiene alto riesgo, el objetivo es tener menos de 70 mg/dl.  En relacin al HDL, el objetivo es [employer] 40 y 56 mg/dl para los hombres y entre 68 y 63 mg/dl para las mujeres. Un nivel de colesterol HDL de 60 mg/dl o superior da una cierta proteccin contra la enfermedad cardaca.  En relacin a los triglicridos, el objetivo es tener menos de 150 mg/dl. Vacunas  Se recomienda aplicar de forma anual la vacuna contra la gripe a  todas las personas de 6 meses en adelante que tengan diabetes.  La vacuna contra la neumona (antineumoccica) est recomendada para todas las personas de 2 aos en adelante que tengan diabetes. Los adultos de 65 aos o ms pueden recibir la vacuna antineumoccica como una serie de dos inyecciones diferentes.  Se recomienda administrar la vacuna contra la hepatitis B en adultos poco despus de que hayan recibido el diagnstico de diabetes.  La vacuna Tdap (contra el ttanos, la difteria y la tosferina) debe aplicarse de la siguiente manera:  Segn las pautas normales de vacunacin infantil en el caso de los nios.  Cada 10 aos en el caso de los adultos con diabetes. Educacin para el autocontrol de la diabetes  Recomendaciones al momento del diagnstico y los controles segn sea necesario. Plan de tratamiento  Su plan de tratamiento ser revisado en cada visita mdica.   Esta informacin no tiene Marine scientist el consejo del mdico. Asegrese de hacerle al mdico cualquier pregunta que tenga.   Document Released: 08/31/2009 Document Revised: 06/27/2014 Elsevier Interactive Patient Education [employer].
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
## 2015-10-25 NOTE — ED Notes (Signed)
Discharge instructions given to patient in spanish.  Discussed importance of obtaining PCP for further refills and for follow up of diabetes management.

## 2015-10-26 LAB — HEMOGLOBIN A1C: HEMOGLOBIN A1C: 7.7 % — AB (ref 4.0–6.0)
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
## 2017-11-18 ENCOUNTER — Emergency Department
Admission: EM | Admit: 2017-11-18 | Discharge: 2017-11-18 | Disposition: A | Discharge status: Home / Self Care | Payer: BLUE CROSS/BLUE SHIELD | Attending: Emergency Medicine | Admitting: Emergency Medicine

## 2017-11-18 ENCOUNTER — Emergency Department: Payer: BLUE CROSS/BLUE SHIELD

## 2017-11-18 ENCOUNTER — Other Ambulatory Visit: Payer: Self-pay

## 2017-11-18 DIAGNOSIS — W540XXA Bitten by dog, initial encounter: Secondary | ICD-10-CM | POA: Diagnosis not present

## 2017-11-18 DIAGNOSIS — Z79899 Other long term (current) drug therapy: Secondary | ICD-10-CM | POA: Diagnosis not present

## 2017-11-18 DIAGNOSIS — Y939 Activity, unspecified: Secondary | ICD-10-CM | POA: Insufficient documentation

## 2017-11-18 DIAGNOSIS — Y999 Unspecified external cause status: Secondary | ICD-10-CM | POA: Diagnosis not present

## 2017-11-18 DIAGNOSIS — S59912A Unspecified injury of left forearm, initial encounter: Secondary | ICD-10-CM | POA: Diagnosis present

## 2017-11-18 DIAGNOSIS — Y92008 Other place in unspecified non-institutional (private) residence as the place of occurrence of the external cause: Secondary | ICD-10-CM | POA: Diagnosis not present

## 2017-11-18 DIAGNOSIS — S51832A Puncture wound without foreign body of left forearm, initial encounter: Secondary | ICD-10-CM | POA: Diagnosis not present

## 2017-11-18 MED ORDER — AMOXICILLIN-POT CLAVULANATE 875-125 MG PO TABS: 1.0000 | ORAL_TABLET | Freq: Two times a day (BID) | ORAL | 0 refills | Status: AC

## 2017-11-18 NOTE — ED Notes (Signed)
Interpreter at bedside, midlevel notified

## 2017-11-18 NOTE — ED Triage Notes (Addendum)
Dog bite to left forearm.  No active bleeding at this time.  States it was neighbors dog, it launched itself at patient and tether came loose and dog bite.  Info obtained via [facility] interpreter [place].  Patient states police are aware.

## 2017-11-18 NOTE — ED Provider Notes (Signed)
[facility] Emergency Department Provider Note  ____________________________________________  Time seen: Approximately 10:00 PM  I have reviewed the triage vital signs and the nursing notes.   HISTORY  Chief Complaint Animal Bite    HPI Justin Cordova is a 62 y.o. male presents to the emergency department with 2  superficial abrasions along the left dorsal forearm after being bitten by his neighbor's dog.  Neighbor's dog is available to be detained and observed.  Patient is not experiencing left upper extremity avoidance.  No weakness, radiculopathy or changes in sensation of the upper extremities. No alleviating measures have been attempted.    No past medical history on file.  There are no active problems to display for this patient.   Past Surgical History:  Procedure Laterality Date  . APPENDECTOMY      Prior to Admission medications   Medication Sig Start Date End Date Taking? Authorizing Provider  amoxicillin-clavulanate (AUGMENTIN) 875-125 MG tablet Take 1 tablet by mouth 2 (two) times daily for 10 days. 11/18/17 11/28/17  Orvil FeilWoods, Johnathon Mittal M, PA-C  diazepam (VALIUM) 5 MG tablet Take 1 tablet (5 mg total) by mouth every 8 (eight) hours as needed for muscle spasms. 10/06/15   Sharman CheekStafford, Phillip, MD  metFORMIN (GLUCOPHAGE) 500 MG tablet Take 1 tablet (500 mg total) by mouth 2 (two) times daily with a meal. 10/25/15   Sharman CheekStafford, Phillip, MD  naproxen (NAPROSYN) 500 MG tablet Take 1 tablet (500 mg total) by mouth 2 (two) times daily with a meal. 10/06/15   Sharman CheekStafford, Phillip, MD  ondansetron (ZOFRAN ODT) 4 MG disintegrating tablet Take 1 tablet (4 mg total) by mouth every 8 (eight) hours as needed for nausea or vomiting. 10/25/15   Sharman CheekStafford, Phillip, MD  oxyCODONE-acetaminophen (ROXICET) 5-325 MG tablet Take 1 tablet by mouth every 6 (six) hours as needed for severe pain. 10/06/15   Sharman CheekStafford, Phillip, MD    Allergies Patient has no known allergies.  No  family history on file.  Social History Social History   Tobacco Use  . Smoking status: Never Smoker  . Smokeless tobacco: Never Used  Substance Use Topics  . Alcohol use: No  . Drug use: No     Review of Systems  Constitutional: No fever/chills Eyes: No visual changes. No discharge ENT: No upper respiratory complaints. Cardiovascular: no chest pain. Respiratory: no cough. No SOB. Musculoskeletal: Negative for musculoskeletal pain. Skin: Patient has abrasions from dog bite.  Neurological: Negative for headaches, focal weakness or numbness.  ____________________________________________   PHYSICAL EXAM:  VITAL SIGNS: ED Triage Vitals  Enc Vitals Group     BP 11/18/17 1951 (!) 148/77     Pulse Rate 11/18/17 1951 65     Resp 11/18/17 1951 18     Temp 11/18/17 1951 97.7 F (36.5 C)     Temp Source 11/18/17 1951 Oral     SpO2 --      Weight 11/18/17 1957 170 lb (77.1 kg)     Height 11/18/17 1957 5\' 4"  (1.626 m)     Head Circumference --      Peak Flow --      Pain Score 11/18/17 1956 9     Pain Loc --      Pain Edu? --      Excl. in GC? --      Constitutional: Alert and oriented. Well appearing and in no acute distress. Eyes: Conjunctivae are normal. PERRL. EOMI. Head: Atraumatic. Cardiovascular: Normal rate, regular rhythm. Normal S1 and  S2.  Good peripheral circulation. Respiratory: Normal respiratory effort without tachypnea or retractions. Lungs CTAB. Good air entry to the bases with no decreased or absent breath sounds. Musculoskeletal: Full range of motion to all extremities. No gross deformities appreciated. Neurologic:  Normal speech and language. No gross focal neurologic deficits are appreciated.  Skin: Patient has abrasions along the dorsal aspect of the left forearm from a dog bite.  Palpable dorsalis pedis pulse, left.    ____________________________________________   LABS (all labs ordered are listed, but only abnormal results are  displayed)  Labs Reviewed - No data to display ____________________________________________  EKG   ____________________________________________  RADIOLOGY Geraldo Pitter, personally viewed and evaluated these images (plain radiographs) as part of my medical decision making, as well as reviewing the written report by the radiologist.  Dg Forearm Left  Result Date: 11/18/2017 CLINICAL DATA:  Recent dog bite EXAM: LEFT FOREARM - 2 VIEW COMPARISON:  None. FINDINGS: No acute fracture or dislocation is noted. No gross soft tissue abnormality is seen. Prominent nutrient vessel in the mid ulna is noted. IMPRESSION: No acute abnormality noted. Electronically Signed   By: Alcide Clever M.D.   On: 11/18/2017 20:40    ____________________________________________    PROCEDURES  Procedure(s) performed:    Procedures    Medications - No data to display   ____________________________________________   INITIAL IMPRESSION / ASSESSMENT AND PLAN / ED COURSE  Pertinent labs & imaging results that were available during my care of the patient were reviewed by me and considered in my medical decision making (see chart for details).  Review of the [place] CSRS was performed in accordance of the [facility] prior to dispensing any controlled drugs.    Assessment and plan Dog bite Patient presents to the emergency department with a dog bite sustained to the left forearm.  X-ray examination revealed no acute fractures or retained foreign bodies.  Basic wound care was provided in the emergency department and patient was discharged with Augmentin.  Patient education regarding the high likelihood for infection was given without compliance to an antibiotic regimen.  Patient voiced understanding through the use of a Engineer, structural.  Rabies vaccine was not given due to ability for quarantine of the dog that bit patient.  All patient questions were  answered.    ____________________________________________  FINAL CLINICAL IMPRESSION(S) / ED DIAGNOSES  Final diagnoses:  Dog bite, initial encounter      NEW MEDICATIONS STARTED DURING THIS VISIT:  ED Discharge Orders        Ordered    amoxicillin-clavulanate (AUGMENTIN) 875-125 MG tablet  2 times daily     11/18/17 2048          This chart was dictated using voice recognition software/Dragon. Despite best efforts to proofread, errors can occur which can change the meaning. Any change was purely unintentional.    Orvil Feil, PA-C 11/18/17 2234    Emily Filbert, MD 11/18/17 [phone]

## 2017-11-18 NOTE — ED Notes (Signed)
Pt states the animal that bit him has been reported to police and it will be taken to be quarantined

## 2017-11-18 NOTE — ED Notes (Signed)
Interpretive services request entered.
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
## 2017-11-18 NOTE — ED Notes (Signed)
Pt verbalized discharge instructions and has no questions at this time 

## 2018-04-03 ENCOUNTER — Other Ambulatory Visit: Payer: Self-pay

## 2018-04-03 ENCOUNTER — Encounter
Admission: RE | Disposition: A | Discharge status: Home / Self Care | Payer: Self-pay | Source: Ambulatory Visit | Attending: Internal Medicine

## 2018-04-03 ENCOUNTER — Ambulatory Visit: Payer: BLUE CROSS/BLUE SHIELD | Admitting: Anesthesiology

## 2018-04-03 ENCOUNTER — Encounter: Payer: Self-pay | Admitting: *Deleted

## 2018-04-03 ENCOUNTER — Ambulatory Visit
Admission: RE | Admit: 2018-04-03 | Discharge: 2018-04-03 | Disposition: A | Discharge status: Home / Self Care | Payer: BLUE CROSS/BLUE SHIELD | Source: Ambulatory Visit | Attending: Internal Medicine | Admitting: Internal Medicine

## 2018-04-03 DIAGNOSIS — Z7984 Long term (current) use of oral hypoglycemic drugs: Secondary | ICD-10-CM | POA: Diagnosis not present

## 2018-04-03 DIAGNOSIS — K640 First degree hemorrhoids: Secondary | ICD-10-CM | POA: Diagnosis not present

## 2018-04-03 DIAGNOSIS — I10 Essential (primary) hypertension: Secondary | ICD-10-CM | POA: Insufficient documentation

## 2018-04-03 DIAGNOSIS — E7800 Pure hypercholesterolemia, unspecified: Secondary | ICD-10-CM | POA: Insufficient documentation

## 2018-04-03 DIAGNOSIS — Z1211 Encounter for screening for malignant neoplasm of colon: Secondary | ICD-10-CM | POA: Insufficient documentation

## 2018-04-03 DIAGNOSIS — E119 Type 2 diabetes mellitus without complications: Secondary | ICD-10-CM | POA: Diagnosis not present

## 2018-04-03 DIAGNOSIS — Z79899 Other long term (current) drug therapy: Secondary | ICD-10-CM | POA: Diagnosis not present

## 2018-04-03 HISTORY — DX: Type 2 diabetes mellitus without complications: E11.9

## 2018-04-03 HISTORY — PX: COLONOSCOPY WITH PROPOFOL: SHX5780

## 2018-04-03 HISTORY — DX: Essential (primary) hypertension: I10

## 2018-04-03 HISTORY — DX: Pure hypercholesterolemia, unspecified: E78.00

## 2018-04-03 LAB — GLUCOSE, CAPILLARY: GLUCOSE-CAPILLARY: 80 mg/dL (ref 70–99)

## 2018-04-03 SURGERY — COLONOSCOPY WITH PROPOFOL
Anesthesia: General

## 2018-04-03 MED ORDER — LIDOCAINE HCL (PF) 2 % IJ SOLN
INTRAMUSCULAR | Status: AC
  Filled 2018-04-03: qty 10

## 2018-04-03 MED ORDER — PROPOFOL 500 MG/50ML IV EMUL
INTRAVENOUS | Status: AC
  Filled 2018-04-03: qty 50

## 2018-04-03 MED ORDER — PROPOFOL 10 MG/ML IV BOLUS
INTRAVENOUS | Status: DC | PRN
  Administered 2018-04-03: 80 mg via INTRAVENOUS

## 2018-04-03 MED ORDER — SODIUM CHLORIDE 0.9 % IV SOLN
INTRAVENOUS | Status: DC
  Administered 2018-04-03: 09:00:00 via INTRAVENOUS

## 2018-04-03 MED ORDER — LIDOCAINE HCL (CARDIAC) PF 100 MG/5ML IV SOSY
PREFILLED_SYRINGE | INTRAVENOUS | Status: DC | PRN
  Administered 2018-04-03: 100 mg via INTRAVENOUS

## 2018-04-03 MED ORDER — PROPOFOL 500 MG/50ML IV EMUL
INTRAVENOUS | Status: DC | PRN
  Administered 2018-04-03: 150 ug/kg/min via INTRAVENOUS

## 2018-04-03 NOTE — Anesthesia Postprocedure Evaluation (Signed)
Anesthesia Post Note  Patient: Justin Cordova  Procedure(s) Performed: COLONOSCOPY WITH PROPOFOL (N/A )  Patient location during evaluation: Endoscopy Anesthesia Type: General Level of consciousness: awake and alert Pain management: pain level controlled Vital Signs Assessment: post-procedure vital signs reviewed and stable Respiratory status: spontaneous breathing, nonlabored ventilation, respiratory function stable and patient connected to nasal cannula oxygen Cardiovascular status: blood pressure returned to baseline and stable Postop Assessment: no apparent nausea or vomiting Anesthetic complications: no     Last Vitals:  Vitals:   04/03/18 0942 04/03/18 1006  BP: 129/79 130/70  Pulse:    Resp:  16  Temp:    SpO2:  98%    Last Pain:  Vitals:   04/03/18 1006  TempSrc:   PainSc: 0-No pain                 Lenard Simmer

## 2018-04-03 NOTE — Interval H&P Note (Signed)
History and Physical Interval Note:  04/03/2018 9:00 AM  Justin Cordova  has presented today for surgery, with the diagnosis of COLON CANCER SCREENING  The various methods of treatment have been discussed with the patient and family. After consideration of risks, benefits and other options for treatment, the patient has consented to  Procedure(s): COLONOSCOPY WITH PROPOFOL (N/A) as a surgical intervention .  The patient's history has been reviewed, patient examined, no change in status, stable for surgery.  I have reviewed the patient's chart and labs.  Questions were answered to the patient's satisfaction.     [place], [place]

## 2018-04-03 NOTE — Anesthesia Post-op Follow-up Note (Signed)
Anesthesia QCDR form completed.        

## 2018-04-03 NOTE — Transfer of Care (Signed)
Immediate Anesthesia Transfer of Care Note  Patient: Justin Cordova  Procedure(s) Performed: COLONOSCOPY WITH PROPOFOL (N/A )  Patient Location: Endoscopy Unit  Anesthesia Type:General  Level of Consciousness: sedated  Airway & Oxygen Therapy: Patient Spontanous Breathing and Patient connected to nasal cannula oxygen  Post-op Assessment: Report given to RN and Post -op Vital signs reviewed and stable  Post vital signs: Reviewed and stable  Last Vitals:  Vitals Value Taken Time  BP 98/70 04/03/2018  9:23 AM  Temp 36.1 C 04/03/2018  9:23 AM  Pulse 61 04/03/2018  9:26 AM  Resp 12 04/03/2018  9:26 AM  SpO2 100 % 04/03/2018  9:26 AM  Vitals shown include unvalidated device data.  Last Pain:  Vitals:   04/03/18 0923  TempSrc: Tympanic  PainSc:          Complications: No apparent anesthesia complications

## 2018-04-03 NOTE — Op Note (Signed)
[facility] Gastroenterology Patient Name: Justin Cordova Procedure Date: 04/03/2018 8:57 AM MRN: [id number] Account #: [id number] Date of Birth: 1955-12-09 Admit Type: Outpatient Age: 62 Room: [facility] ENDO ROOM 3 Gender: Male Note Status: Finalized Procedure:            Colonoscopy Indications:          Screening for colorectal malignant neoplasm Providers:            Boykin Nearing. Enna Warwick MD, MD Medicines:            Propofol per Anesthesia Complications:        No immediate complications. Procedure:            Pre-Anesthesia Assessment:                       - The risks and benefits of the procedure and the                        sedation options and risks were discussed with the                        patient. All questions were answered and informed                        consent was obtained.                       - Patient identification and proposed procedure were                        verified prior to the procedure by the nurse. The                        procedure was verified in the procedure room.                       - ASA Grade Assessment: II - A patient with mild                        systemic disease.                       - After reviewing the risks and benefits, the patient                        was deemed in satisfactory condition to undergo the                        procedure.                       After obtaining informed consent, the colonoscope was                        passed under direct vision. Throughout the procedure,                        the patient's blood pressure, pulse, and oxygen                        saturations were monitored continuously. The  Colonoscope was introduced through the anus and                        advanced to the the cecum, identified by appendiceal                        orifice and ileocecal valve. The colonoscopy was                        performed without difficulty. The  patient tolerated the                        procedure well. The quality of the bowel preparation                        was excellent. Findings:      The perianal and digital rectal examinations were normal. Pertinent       negatives include normal sphincter tone and no palpable rectal lesions.      The colon (entire examined portion) appeared normal.      Non-bleeding internal hemorrhoids were found during retroflexion. The       hemorrhoids were Grade I (internal hemorrhoids that do not prolapse).      The exam was otherwise without abnormality.      The terminal ileum appeared normal. Impression:           - The entire examined colon is normal.                       - Non-bleeding internal hemorrhoids.                       - The examination was otherwise normal.                       - No specimens collected. Recommendation:       - Patient has a contact number available for                        emergencies. The signs and symptoms of potential                        delayed complications were discussed with the patient.                        Return to normal activities tomorrow. Written discharge                        instructions were provided to the patient.                       - Resume previous diet.                       - Continue present medications.                       - Repeat colonoscopy in 10 years for screening purposes.                       - The findings and recommendations were discussed with  the patient and their spouse. Procedure Code(s):    --- Professional ---                       Z6109, Colorectal cancer screening; colonoscopy on                        individual not meeting criteria for high risk Diagnosis Code(s):    --- Professional ---                       K64.0, First degree hemorrhoids                       Z12.11, Encounter for screening for malignant neoplasm                        of colon CPT copyright 2018 American  Medical Association. All rights reserved. The codes documented in this report are preliminary and upon coder review may  be revised to meet current compliance requirements. Stanton Kidney MD, MD 04/03/2018 9:22:21 AM This report has been signed electronically. Number of Addenda: 0 Note Initiated On: 04/03/2018 8:57 AM Scope Withdrawal Time: 0 hours 6 minutes 59 seconds  Total Procedure Duration: 0 hours 9 minutes 27 seconds       [facility]
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
## 2019-10-05 ENCOUNTER — Ambulatory Visit: Payer: BC Managed Care – PPO | Attending: Internal Medicine

## 2019-10-05 DIAGNOSIS — Z23 Encounter for immunization: Secondary | ICD-10-CM

## 2019-10-05 NOTE — Progress Notes (Signed)
   Covid-19 Vaccination Clinic  Name:  Justin Cordova    MRN: [id number] DOB: 07-13-1955  10/05/2019  Mr. Justin Cordova was observed post Covid-19 immunization for 15 minutes without incident. He was provided with Vaccine Information Sheet and instruction to access the V-Safe system.   Mr. Justin Cordova was instructed to call 911 with any severe reactions post vaccine: Marland Kitchen Difficulty breathing  . Swelling of face and throat  . A fast heartbeat  . A bad rash all over body  . Dizziness and weakness   Immunizations Administered    Name Date Dose VIS Date Route   Pfizer COVID-19 Vaccine 10/05/2019 12:06 PM 0.3 mL 05/31/2019 Intramuscular   Manufacturer: [employer], [employer]   Lot: [id number]   NDC: [address]-8

## 2020-03-01 ENCOUNTER — Telehealth: Payer: Self-pay | Admitting: Emergency Medicine

## 2022-02-09 ENCOUNTER — Emergency Department: Payer: No Typology Code available for payment source

## 2022-02-09 ENCOUNTER — Encounter: Payer: Self-pay | Admitting: Emergency Medicine

## 2022-02-09 ENCOUNTER — Emergency Department
Admission: EM | Admit: 2022-02-09 | Discharge: 2022-02-09 | Disposition: A | Discharge status: Home / Self Care | Payer: No Typology Code available for payment source | Attending: Emergency Medicine | Admitting: Emergency Medicine

## 2022-02-09 ENCOUNTER — Other Ambulatory Visit: Payer: Self-pay

## 2022-02-09 DIAGNOSIS — M5416 Radiculopathy, lumbar region: Secondary | ICD-10-CM | POA: Insufficient documentation

## 2022-02-09 DIAGNOSIS — M545 Low back pain, unspecified: Secondary | ICD-10-CM | POA: Diagnosis present

## 2022-02-09 MED ORDER — LIDOCAINE 5 % EX PTCH: 1.0000 | MEDICATED_PATCH | Freq: Two times a day (BID) | CUTANEOUS | 0 refills | Status: AC

## 2022-02-09 MED ORDER — NAPROXEN 500 MG PO TABS: 500.0000 mg | ORAL_TABLET | Freq: Two times a day (BID) | ORAL | 0 refills | Status: AC

## 2022-02-09 MED ORDER — KETOROLAC TROMETHAMINE 15 MG/ML IJ SOLN
15.0000 mg | Freq: Once | INTRAMUSCULAR | Status: AC
  Administered 2022-02-09: 15 mg via INTRAMUSCULAR
  Filled 2022-02-09: qty 1

## 2022-02-09 MED ORDER — ACETAMINOPHEN 325 MG PO TABS
650.0000 mg | ORAL_TABLET | Freq: Once | ORAL | Status: AC
  Administered 2022-02-09: 650 mg via ORAL
  Filled 2022-02-09: qty 2

## 2022-02-09 MED ORDER — LIDOCAINE 5 % EX PTCH
1.0000 | MEDICATED_PATCH | CUTANEOUS | Status: DC
  Administered 2022-02-09: 1 via TRANSDERMAL
  Filled 2022-02-09: qty 1

## 2022-02-09 NOTE — ED Provider Notes (Signed)
[facility] Provider Note    Event Date/Time   First MD Initiated Contact with Patient 02/09/22 1000     (approximate)   History   No chief complaint on file.   HPI  Justin Cordova is a 66 y.o. male who presents today for evaluation of left lateral leg pain.  Patient reports that this has been ongoing for approximately 3 weeks.  He reports that he works in [employer] and does a lot of heavy lifting.  He reports that his pain begins in his low back and radiates down his left leg, lateral aspect.  He reports that he has pain with ambulation but has not had any trouble walking.  No urinary or fecal incontinence or retention.  No fevers or chills.  No abdominal pain or chest pain.  He has not taken anything for his symptoms.  There are no problems to display for this patient.         Physical Exam   Triage Vital Signs: ED Triage Vitals  Enc Vitals Group     BP 02/09/22 0936 (!) 161/81     Pulse Rate 02/09/22 0936 97     Resp 02/09/22 0936 16     Temp 02/09/22 0936 98 F (36.7 C)     Temp src --      SpO2 --      Weight 02/09/22 0938 169 lb 15.6 oz (77.1 kg)     Height 02/09/22 0938 5\' 5"  (1.651 m)     Head Circumference --      Peak Flow --      Pain Score 02/09/22 0938 10     Pain Loc --      Pain Edu? --      Excl. in GC? --     Most recent vital signs: Vitals:   02/09/22 0936  BP: (!) 161/81  Pulse: 97  Resp: 16  Temp: 98 F (36.7 C)    Physical Exam Vitals and nursing note reviewed.  Constitutional:      General: Awake and alert. No acute distress.    Appearance: Normal appearance. The patient is normal weight.  HENT:     Head: Normocephalic and atraumatic.     Mouth: Mucous membranes are moist.  Eyes:     General: PERRL. Normal EOMs        Right eye: No discharge.        Left eye: No discharge.     Conjunctiva/sclera: Conjunctivae normal.  Cardiovascular:     Rate and Rhythm: Normal rate and regular rhythm.      Pulses: Normal pulses.     Heart sounds: Normal heart sounds Pulmonary:     Effort: Pulmonary effort is normal. No respiratory distress.     Breath sounds: Normal breath sounds.  Abdominal:     Abdomen is soft. There is no abdominal tenderness. No rebound or guarding. No distention. Musculoskeletal:        General: No swelling. Normal range of motion.     Cervical back: Normal range of motion and neck supple.  Back: No midline tenderness. Strength and sensation 5/5 to bilateral lower extremities. Normal great toe extension against resistance. Normal sensation throughout feet. Normal patellar reflexes. Negative SLR and opposite SLR bilaterally. Negative FABER test Skin:    General: Skin is warm and dry.     Capillary Refill: Capillary refill takes less than 2 seconds.     Findings: No rash.  Neurological:  Mental Status: The patient is awake and alert.      ED Results / Procedures / Treatments   Labs (all labs ordered are listed, but only abnormal results are displayed) Labs Reviewed - No data to display   EKG     RADIOLOGY I independently reviewed and interpreted imaging and agree with radiologists findings.     PROCEDURES:  Critical Care performed:   Procedures   MEDICATIONS ORDERED IN ED: Medications  lidocaine (LIDODERM) 5 % 1 patch (1 patch Transdermal Patch Applied 02/09/22 1114)  ketorolac (TORADOL) 15 MG/ML injection 15 mg (15 mg Intramuscular Given 02/09/22 1114)  acetaminophen (TYLENOL) tablet 650 mg (650 mg Oral Given 02/09/22 1116)     IMPRESSION / MDM / ASSESSMENT AND PLAN / ED COURSE  I reviewed the triage vital signs and the nursing notes.   Differential diagnosis includes, but is not limited to, with a history of back pain who presents with back pain. 5 out of 5 strength with intact sensation to extensor hallucis dorsiflexion and plantarflexion of bilateral lower extremities with normal patellar reflexes bilaterally. Most likely etiology at  this point is muscle strain vs herniated disc.  Ultrasound was obtained given that he had pain in his calf as well, though there is no pitting edema, and ultrasound was negative.  No red flags to indicate patient is at risk for more auspicious process that would require urgent/emergent spinal imaging or subspecialty evaluation at this time. No major trauma, no midline tenderness, no history or physical exam findings to suggest cauda equina syndrome or spinal cord compression. No focal neurological deficits on exam. No constitutional symptoms or history of immunosuppression or IVDA to suggest potential for epidural abscess. Not anticoagulated, no history of bleeding diastasis to suggest risk for epidural hematoma. No chronic steroid use or advanced age or history of malignancy to suggest proclivity towards pathological fracture.  No abdominal pain or flank pain to suggest kidney stone, no history of kidney stone.  No fever or dysuria or CVAT to suggest pyelonephritis .  No chest pain, back pain, shortness of breath, neurological deficits, to suggest vascular catastrophe, and pulses are equal in all 4 extremities.  Discussed care instructions and return precautions with patient. Recommended close outpatient follow-up for re-evaluation. Patient agrees with plan of care. Will treat the patient symptomatically as needed for pain control. Will discharge patient to take these medications and return for any worsening or different pain or development of any neurologic symptoms. Educated patient regarding expected time course for back pain to improve and recommended very close outpatient follow-up.   Patient's presentation is most consistent with acute complicated illness / injury requiring diagnostic workup.     FINAL CLINICAL IMPRESSION(S) / ED DIAGNOSES   Final diagnoses:  Lumbar radiculopathy     Rx / DC Orders   ED Discharge Orders          Ordered    lidocaine (LIDODERM) 5 %  Every 12 hours         02/09/22 1333    naproxen (NAPROSYN) 500 MG tablet  2 times daily with meals        02/09/22 1333             Note:  This document was prepared using Dragon voice recognition software and may include unintentional dictation errors.   Keturah Shavers 02/09/22 1336    Dionne Bucy, MD 02/09/22 [phone]

## 2022-02-09 NOTE — ED Triage Notes (Signed)
Arrives today with c/o  pain to left lower leg.  Seen for same through [facility] clinic.  C/O 'strong' pain to lower leg feels like skin is going to tear.  Points to left lower leg lateral.  States calf hurts as well, but worse to side of leg.  More pain with walking.  Also c/o pain to top of foot.  Denies injury.

## 2022-02-09 NOTE — Discharge Instructions (Addendum)
Your ultrasound was normal.  Your pain is likely coming from your back and causing a pinched nerve.  You may take the medications as prescribed.  Please refrain from heavy lifting. Please return to the emergency department for any new, worsening, or changing symptoms or other concerns including weakness in your legs, urinary or stool incontinence or retention, numbness or tingling in your extremities/buttocks/groin, fevers, or any other concerns or change in symptoms.  It was a pleasure caring for you today.

## 2022-02-09 NOTE — ED Notes (Signed)
Pt verbalized understanding of discharge instructions. Opportunity for questions provided.  

## 2022-02-09 NOTE — ED Notes (Addendum)
See triage note. Pt t ED and ambulatory to room with slow gait. Pt reports left lower leg/foot pain that has bene ongoing for 2-3 weeks. Pt denies injury. Pt reports pain is more localized to left calf.
